# Patient Record
Sex: Male | Born: 1990 | Race: Black or African American | Hispanic: No | Marital: Single | Smoking: Current some day smoker
Health system: Southern US, Community
[De-identification: ages and names within clinical notes are randomized; demographics above are authoritative.]

## PROBLEM LIST (undated history)

## (undated) DIAGNOSIS — Z87898 Personal history of other specified conditions: Secondary | ICD-10-CM

## (undated) DIAGNOSIS — S0285XA Fracture of orbit, unspecified, initial encounter for closed fracture: Secondary | ICD-10-CM

## (undated) DIAGNOSIS — F1010 Alcohol abuse, uncomplicated: Secondary | ICD-10-CM

---

## 2013-09-11 ENCOUNTER — Encounter (HOSPITAL_COMMUNITY): Payer: Self-pay | Admitting: Emergency Medicine

## 2013-09-11 ENCOUNTER — Emergency Department (HOSPITAL_COMMUNITY)
Admission: EM | Admit: 2013-09-11 | Discharge: 2013-09-11 | Disposition: A | Discharge status: Home / Self Care | Payer: Medicaid Other | Source: Home / Self Care | Attending: Emergency Medicine | Admitting: Emergency Medicine

## 2013-09-11 DIAGNOSIS — Z98811 Dental restoration status: Secondary | ICD-10-CM

## 2013-09-11 NOTE — ED Notes (Signed)
Pt  Has  A   Loose  Front  Tooth  For  Over  1  Year     Ne  denys  Any pain he  denys  Any bleeding  He  denys  Any  Recent  Injury          The  Tooth is  In  Place  But  He  Can slide  It  Up and  Down

## 2013-09-11 NOTE — Discharge Instructions (Signed)
Get appointment to see Dr. Ocie DoyneScott Jensen as soon as possible.  In the meantime, do not eat any candy or sticky food.  Brush teeth gently twice daily.  Do not use dental floss.  You can purchase a dental cement at the pharmacy or can use toothpaste as a glue or denture cement.   Go to www.goodrx.com to look up your medications. This will give you a list of where you can find your prescriptions at the most affordable prices.   RESOURCE GUIDE  Dental Problems  Look up DebtSupply.plhttp://www.ncdental.org/ncds/Schedule.asp for a schedule of the [facility]'s free dental clinics called [address] [facility]. They have clinics all around [place]. Get there early and be prepared to wait.   [facility] [address]  [place], [place] [address] [phone]  [facility] [address] [place], [place] [phone]  Patients with Medicaid: [facility]                     [facility] [phone]                                [phone] Phone:  [phone]                                                  Phone:  [phone]  If unable to pay or uninsured, contact:  [facility] or [facility] to become qualified for the adult dental clinic.

## 2013-09-11 NOTE — ED Provider Notes (Signed)
  Chief Complaint   Chief Complaint  Patient presents with  . Dental Problem    History of Present Illness   Brandon Richards is a 23 year old [place] refugee who has only been in the [place] for about 4 weeks. He has a crown on his left, first, upper incisor. This came off a few months ago. He's able to pull it off and on at will. It's not painful. There is no swelling of the gingiva. He has no trouble swallowing or chewing. He does not have a dentist in the area.  Review of Systems   Other than as noted above, the patient denies any of the following symptoms: Systemic:  No fever, chills,  Or sweats. ENT:  No headache, ear ache, sore throat, nasal congestion, facial pain, or swelling. Lymphatic:  No adenopathy. Lungs:  No coughing, wheezing or shortness of breath.  PMFSH   Past medical history, family history, social history, meds, and allergies were reviewed.   Physical Examination     Vital signs:  BP 114/65  Pulse 68  Temp(Src) 98.9 F (37.2 C) (Oral)  Resp 16  SpO2 100% General:  Alert, oriented, in no distress. ENT:  TMs and canals normal.  Nasal mucosa normal. Mouth exam:  He has several carious teeth. His last, upper, first incisor has been crown. A crown is removable with pressure, but does seem to stay in place unless she pulls out. The gingiva surrounding it is noninflamed and it does not appear to be any decay. Neck:  No swelling or adenopathy. Lungs:  Breath sounds clear and equal bilaterally.  No wheezes, rales or rhonchi. Heart:  Regular rhythm.  No gallops or murmers. Skin:  Clear, warm and dry.   Assessment   The encounter diagnosis was S/p dental crown.  He has a dental crown that has come off. Suggested he buy over-the-counter dental adhesive, use toothpaste, or use a denture adhesive until he can get in to see the dentist.  Plan   1.  Meds:  The following meds were prescribed:  There are no discharge medications for this patient.   2.  Patient  Education/Counseling:  The patient was given appropriate handouts, self care instructions, and instructed in symptomatic relief. I suggested gentle brushing and avoidance of flossing. He should also avoid eating candy or sticky foods.  3.  Follow up:  The patient was told to follow up if no better in 3 to 4 days, if becoming worse in any way, and given some red flag symptoms such as difficulty swallowing or breathing which would prompt immediate return.  Follow up with a dentist as soon as posssible.     Reuben Likesavid C Dejaun Vidrio, MD 09/11/13 [phone]

## 2013-12-11 ENCOUNTER — Ambulatory Visit: Payer: Medicaid Other | Admitting: Family Medicine

## 2015-08-09 DIAGNOSIS — S0285XA Fracture of orbit, unspecified, initial encounter for closed fracture: Secondary | ICD-10-CM

## 2015-08-09 HISTORY — DX: Fracture of orbit, unspecified, initial encounter for closed fracture: S02.85XA

## 2015-10-25 ENCOUNTER — Encounter (HOSPITAL_COMMUNITY): Payer: Self-pay | Admitting: *Deleted

## 2015-10-25 ENCOUNTER — Emergency Department (HOSPITAL_COMMUNITY)
Admission: EM | Admit: 2015-10-25 | Discharge: 2015-10-25 | Disposition: A | Discharge status: Home / Self Care | Payer: Medicaid Other | Attending: Emergency Medicine | Admitting: Emergency Medicine

## 2015-10-25 DIAGNOSIS — S39012A Strain of muscle, fascia and tendon of lower back, initial encounter: Secondary | ICD-10-CM | POA: Insufficient documentation

## 2015-10-25 DIAGNOSIS — Y92410 Unspecified street and highway as the place of occurrence of the external cause: Secondary | ICD-10-CM | POA: Insufficient documentation

## 2015-10-25 DIAGNOSIS — Y998 Other external cause status: Secondary | ICD-10-CM | POA: Insufficient documentation

## 2015-10-25 DIAGNOSIS — F17200 Nicotine dependence, unspecified, uncomplicated: Secondary | ICD-10-CM | POA: Insufficient documentation

## 2015-10-25 DIAGNOSIS — Y9389 Activity, other specified: Secondary | ICD-10-CM | POA: Insufficient documentation

## 2015-10-25 MED ORDER — NAPROXEN 500 MG PO TABS
500.0000 mg | ORAL_TABLET | Freq: Once | ORAL | Status: AC
  Administered 2015-10-25: 500 mg via ORAL
  Filled 2015-10-25: qty 1

## 2015-10-25 MED ORDER — METHOCARBAMOL 500 MG PO TABS
500.0000 mg | ORAL_TABLET | Freq: Two times a day (BID) | ORAL | Status: DC
Start: 2015-10-25 — End: 2017-11-10

## 2015-10-25 MED ORDER — NAPROXEN 500 MG PO TABS: 500.0000 mg | ORAL_TABLET | Freq: Two times a day (BID) | ORAL | Status: DC
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
## 2016-06-11 ENCOUNTER — Emergency Department (HOSPITAL_COMMUNITY)
Admission: EM | Admit: 2016-06-11 | Discharge: 2016-06-11 | Disposition: A | Discharge status: Home / Self Care | Payer: BLUE CROSS/BLUE SHIELD | Attending: Emergency Medicine | Admitting: Emergency Medicine

## 2016-06-11 ENCOUNTER — Emergency Department (HOSPITAL_COMMUNITY): Payer: BLUE CROSS/BLUE SHIELD

## 2016-06-11 ENCOUNTER — Encounter (HOSPITAL_COMMUNITY): Payer: Self-pay | Admitting: Oncology

## 2016-06-11 DIAGNOSIS — Y939 Activity, unspecified: Secondary | ICD-10-CM | POA: Insufficient documentation

## 2016-06-11 DIAGNOSIS — S40021A Contusion of right upper arm, initial encounter: Secondary | ICD-10-CM | POA: Diagnosis not present

## 2016-06-11 DIAGNOSIS — S4991XA Unspecified injury of right shoulder and upper arm, initial encounter: Secondary | ICD-10-CM | POA: Diagnosis present

## 2016-06-11 DIAGNOSIS — F17200 Nicotine dependence, unspecified, uncomplicated: Secondary | ICD-10-CM | POA: Insufficient documentation

## 2016-06-11 DIAGNOSIS — Y929 Unspecified place or not applicable: Secondary | ICD-10-CM | POA: Diagnosis not present

## 2016-06-11 DIAGNOSIS — Y999 Unspecified external cause status: Secondary | ICD-10-CM | POA: Insufficient documentation

## 2016-06-11 MED ORDER — IBUPROFEN 800 MG PO TABS
800.0000 mg | ORAL_TABLET | Freq: Once | ORAL | Status: AC
  Administered 2016-06-11: 800 mg via ORAL
  Filled 2016-06-11: qty 1

## 2016-06-11 MED ORDER — NAPROXEN 500 MG PO TABS: 500.0000 mg | ORAL_TABLET | Freq: Two times a day (BID) | ORAL | 0 refills | Status: DC | PRN
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
## 2016-06-11 NOTE — ED Notes (Signed)
Pt was sleeping in triage.  Would not answer triage questions w/o asking multiple times.  When pt was asked to go to the lobby until a room became available pt became verbally aggressive to staff, stating he came by ambulance and he wanted a room.  Pt was given warm blankets and informed that he would have a room as soon as it was available.

## 2017-08-30 ENCOUNTER — Other Ambulatory Visit: Payer: Self-pay

## 2017-08-30 ENCOUNTER — Emergency Department (HOSPITAL_COMMUNITY)
Admission: EM | Admit: 2017-08-30 | Discharge: 2017-08-30 | Disposition: A | Discharge status: Home / Self Care | Payer: BLUE CROSS/BLUE SHIELD | Attending: Emergency Medicine | Admitting: Emergency Medicine

## 2017-08-30 ENCOUNTER — Encounter (HOSPITAL_COMMUNITY): Payer: Self-pay | Admitting: Obstetrics and Gynecology

## 2017-08-30 DIAGNOSIS — Z046 Encounter for general psychiatric examination, requested by authority: Secondary | ICD-10-CM | POA: Diagnosis present

## 2017-08-30 DIAGNOSIS — F1010 Alcohol abuse, uncomplicated: Secondary | ICD-10-CM | POA: Diagnosis not present

## 2017-08-30 DIAGNOSIS — Z79899 Other long term (current) drug therapy: Secondary | ICD-10-CM | POA: Diagnosis not present

## 2017-08-30 DIAGNOSIS — F17210 Nicotine dependence, cigarettes, uncomplicated: Secondary | ICD-10-CM | POA: Diagnosis not present

## 2017-08-30 LAB — COMPREHENSIVE METABOLIC PANEL
ALK PHOS: 65 U/L (ref 38–126)
ALT: 26 U/L (ref 17–63)
ANION GAP: 7 (ref 5–15)
AST: 32 U/L (ref 15–41)
Albumin: 4.4 g/dL (ref 3.5–5.0)
BUN: 11 mg/dL (ref 6–20)
CALCIUM: 8.8 mg/dL — AB (ref 8.9–10.3)
CO2: 28 mmol/L (ref 22–32)
Chloride: 103 mmol/L (ref 101–111)
Creatinine, Ser: 0.84 mg/dL (ref 0.61–1.24)
GFR calc non Af Amer: >60 mL/min (ref >60)
Glucose, Bld: 86 mg/dL (ref 65–99)
POTASSIUM: 3.9 mmol/L (ref 3.5–5.1)
SODIUM: 138 mmol/L (ref 135–145)
TOTAL PROTEIN: 7.3 g/dL (ref 6.5–8.1)
Total Bilirubin: 0.9 mg/dL (ref 0.3–1.2)

## 2017-08-30 LAB — CBC
HCT: 46.1 % (ref 39.0–52.0)
Hemoglobin: 16.3 g/dL (ref 13.0–17.0)
MCH: 34.8 pg — ABNORMAL HIGH (ref 26.0–34.0)
MCHC: 35.4 g/dL (ref 30.0–36.0)
MCV: 98.3 fL (ref 78.0–100.0)
Platelets: 211 10*3/uL (ref 150–400)
RBC: 4.69 MIL/uL (ref 4.22–5.81)
RDW: 12.4 % (ref 11.5–15.5)
WBC: 4.2 10*3/uL (ref 4.0–10.5)

## 2017-08-30 LAB — RAPID URINE DRUG SCREEN, HOSP PERFORMED
AMPHETAMINES: NOT DETECTED
BENZODIAZEPINES: NOT DETECTED
Barbiturates: NOT DETECTED
Cocaine: NOT DETECTED
OPIATES: NOT DETECTED
Tetrahydrocannabinol: NOT DETECTED

## 2017-08-30 LAB — ETHANOL: Alcohol, Ethyl (B): 221 mg/dL — ABNORMAL HIGH (ref <10)

## 2017-08-30 MED ORDER — VITAMIN B-1 100 MG PO TABS
100.0000 mg | ORAL_TABLET | Freq: Every day | ORAL | Status: DC
  Administered 2017-08-30: 100 mg via ORAL
  Filled 2017-08-30: qty 1

## 2017-08-30 MED ORDER — LORAZEPAM 2 MG/ML IJ SOLN: 0.0000 mg | Freq: Two times a day (BID) | INTRAMUSCULAR | Status: DC

## 2017-08-30 MED ORDER — THIAMINE HCL 100 MG/ML IJ SOLN: 100.0000 mg | Freq: Every day | INTRAMUSCULAR | Status: DC

## 2017-08-30 MED ORDER — LORAZEPAM 1 MG PO TABS: 0.0000 mg | ORAL_TABLET | Freq: Four times a day (QID) | ORAL | Status: DC

## 2017-08-30 MED ORDER — NICOTINE 21 MG/24HR TD PT24: 21.0000 mg | MEDICATED_PATCH | Freq: Every day | TRANSDERMAL | Status: DC

## 2017-08-30 MED ORDER — LORAZEPAM 1 MG PO TABS: 0.0000 mg | ORAL_TABLET | Freq: Two times a day (BID) | ORAL | Status: DC

## 2017-08-30 MED ORDER — LORAZEPAM 2 MG/ML IJ SOLN: 0.0000 mg | Freq: Four times a day (QID) | INTRAMUSCULAR | Status: DC
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
## 2017-08-30 NOTE — ED Notes (Signed)
TTS at bedside. 

## 2017-08-30 NOTE — Discharge Instructions (Signed)
Please read attached information regarding your condition. Follow-up at one of the places listed below in the resource guide to help with your alcohol use. Return to ED for worsening symptoms, tremors or seizures, loss of consciousness or head injuries.

## 2017-08-30 NOTE — ED Notes (Signed)
ED Provider at bedside. 

## 2017-08-30 NOTE — ED Notes (Signed)
Pt assisted to use TCU phone to call his daughter

## 2017-08-30 NOTE — ED Notes (Signed)
Discharge instructions reviewed with pt and pt given counseling regarding seeking help in regards to alcohol consumption.
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
## 2017-08-30 NOTE — ED Notes (Signed)
Pt belongings placed in locked 28
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
## 2017-08-30 NOTE — ED Notes (Signed)
Pt's belongings moved to locker 26 and pt wanded by security. Pt currently dressed out in paper scrubs and trained sitter has pt within sights.

## 2017-08-30 NOTE — ED Provider Notes (Signed)
  Face-to-face evaluation   History: Patient was petitioned for alcoholism, and dangerous behavior, by family members.  He relates drinking alcohol, because "it is my day off."  He denies intent to harm self or others.  Physical exam: Alert, calm, cooperative.  No dysarthria or aphasia.  No respiratory distress.  Medical screening examination/treatment/procedure(s) were conducted as a shared visit with non-physician practitioner(s) and myself.  I personally evaluated the patient during the encounter    Mancel BaleWentz, Anija Brickner, MD 08/31/17 0000

## 2017-10-01 ENCOUNTER — Emergency Department (HOSPITAL_COMMUNITY)
Admission: EM | Admit: 2017-10-01 | Discharge: 2017-10-01 | Disposition: A | Discharge status: Home / Self Care | Payer: PRIVATE HEALTH INSURANCE | Attending: Emergency Medicine | Admitting: Emergency Medicine

## 2017-10-01 ENCOUNTER — Encounter (HOSPITAL_COMMUNITY): Payer: Self-pay | Admitting: Emergency Medicine

## 2017-10-01 DIAGNOSIS — Z79899 Other long term (current) drug therapy: Secondary | ICD-10-CM | POA: Insufficient documentation

## 2017-10-01 DIAGNOSIS — R109 Unspecified abdominal pain: Secondary | ICD-10-CM | POA: Insufficient documentation

## 2017-10-01 DIAGNOSIS — F10129 Alcohol abuse with intoxication, unspecified: Secondary | ICD-10-CM | POA: Insufficient documentation

## 2017-10-01 DIAGNOSIS — F17210 Nicotine dependence, cigarettes, uncomplicated: Secondary | ICD-10-CM | POA: Insufficient documentation

## 2017-10-01 DIAGNOSIS — F10920 Alcohol use, unspecified with intoxication, uncomplicated: Secondary | ICD-10-CM

## 2017-10-01 LAB — RAPID URINE DRUG SCREEN, HOSP PERFORMED
AMPHETAMINES: NOT DETECTED
BENZODIAZEPINES: NOT DETECTED
Barbiturates: NOT DETECTED
COCAINE: NOT DETECTED
Opiates: NOT DETECTED
TETRAHYDROCANNABINOL: NOT DETECTED

## 2017-10-01 NOTE — ED Notes (Signed)
Patient able to ambulate in the hall without difficulty and without assistance. Anticipating discharge at this time.

## 2017-10-01 NOTE — ED Triage Notes (Addendum)
Pt brought home for abd pain onset after drinking ETOH , [facility]  Then called EMS for abd pain request transport to hospital. VSS 112/78 hr 80 resp 16 Pt not cooperative with EMS staff.

## 2017-10-01 NOTE — ED Notes (Signed)
Patient being discharged at this time and being escorted out by security at this time.
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
## 2017-10-01 NOTE — Discharge Instructions (Signed)
Please follow up with [facility]. Return to ED with new or concerning symptoms or if any of the below conditions develop.  Get help right away if: You get shaky when you try to stop drinking. You start shaking and you cannot control it (have a seizure). You throw up blood. The blood may be bright red or look like coffee grounds. You see blood in your poop (stool). The blood may: Be bright red. Make your poop black and tarry and make it smell bad. You start to feel light-headed. You pass out.

## 2017-10-01 NOTE — ED Notes (Signed)
Bed: [facility] Expected date:  Expected time:  Means of arrival:  Comments: 
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
## 2017-11-08 ENCOUNTER — Inpatient Hospital Stay (HOSPITAL_COMMUNITY)
Admission: EM | Admit: 2017-11-08 | Discharge: 2017-11-10 | DRG: 343 | Disposition: A | Discharge status: Home / Self Care | Payer: Self-pay | Attending: Surgery | Admitting: Surgery

## 2017-11-08 ENCOUNTER — Encounter (HOSPITAL_COMMUNITY): Payer: Self-pay | Admitting: Family Medicine

## 2017-11-08 ENCOUNTER — Emergency Department (HOSPITAL_COMMUNITY): Payer: Self-pay

## 2017-11-08 DIAGNOSIS — F1010 Alcohol abuse, uncomplicated: Secondary | ICD-10-CM

## 2017-11-08 DIAGNOSIS — F17210 Nicotine dependence, cigarettes, uncomplicated: Secondary | ICD-10-CM | POA: Diagnosis present

## 2017-11-08 DIAGNOSIS — Z87898 Personal history of other specified conditions: Secondary | ICD-10-CM

## 2017-11-08 DIAGNOSIS — K3580 Unspecified acute appendicitis: Principal | ICD-10-CM

## 2017-11-08 DIAGNOSIS — Z720 Tobacco use: Secondary | ICD-10-CM

## 2017-11-08 HISTORY — DX: Alcohol abuse, uncomplicated: F10.10

## 2017-11-08 HISTORY — DX: Fracture of orbit, unspecified, initial encounter for closed fracture: S02.85XA

## 2017-11-08 HISTORY — DX: Personal history of other specified conditions: Z87.898

## 2017-11-08 LAB — COMPREHENSIVE METABOLIC PANEL
ALBUMIN: 4.3 g/dL (ref 3.5–5.0)
ALT: 28 U/L (ref 17–63)
AST: 32 U/L (ref 15–41)
Alkaline Phosphatase: 59 U/L (ref 38–126)
Anion gap: 10 (ref 5–15)
BUN: 8 mg/dL (ref 6–20)
CHLORIDE: 99 mmol/L — AB (ref 101–111)
CO2: 25 mmol/L (ref 22–32)
Calcium: 9 mg/dL (ref 8.9–10.3)
Creatinine, Ser: 0.77 mg/dL (ref 0.61–1.24)
GFR calc Af Amer: >60 mL/min (ref >60)
Glucose, Bld: 101 mg/dL — ABNORMAL HIGH (ref 65–99)
Potassium: 3.7 mmol/L (ref 3.5–5.1)
SODIUM: 134 mmol/L — AB (ref 135–145)
Total Bilirubin: 1.4 mg/dL — ABNORMAL HIGH (ref 0.3–1.2)
Total Protein: 7.6 g/dL (ref 6.5–8.1)

## 2017-11-08 LAB — CBC
HEMATOCRIT: 48.3 % (ref 39.0–52.0)
Hemoglobin: 17.1 g/dL — ABNORMAL HIGH (ref 13.0–17.0)
MCH: 35.3 pg — ABNORMAL HIGH (ref 26.0–34.0)
MCHC: 35.4 g/dL (ref 30.0–36.0)
MCV: 99.8 fL (ref 78.0–100.0)
Platelets: 184 10*3/uL (ref 150–400)
RBC: 4.84 MIL/uL (ref 4.22–5.81)
RDW: 12.1 % (ref 11.5–15.5)
WBC: 9 10*3/uL (ref 4.0–10.5)

## 2017-11-08 LAB — LIPASE, BLOOD: LIPASE: 33 U/L (ref 11–51)

## 2017-11-08 LAB — URINALYSIS, ROUTINE W REFLEX MICROSCOPIC
Bilirubin Urine: NEGATIVE
GLUCOSE, UA: NEGATIVE mg/dL
Hgb urine dipstick: NEGATIVE
Ketones, ur: 20 mg/dL — AB
LEUKOCYTES UA: NEGATIVE
Nitrite: NEGATIVE
PH: 6 (ref 5.0–8.0)
Protein, ur: NEGATIVE mg/dL
Specific Gravity, Urine: 1.036 — ABNORMAL HIGH (ref 1.005–1.030)

## 2017-11-08 MED ORDER — ADULT MULTIVITAMIN W/MINERALS CH
1.0000 | ORAL_TABLET | Freq: Every day | ORAL | Status: DC
  Administered 2017-11-10: 1 via ORAL
  Filled 2017-11-08: qty 1

## 2017-11-08 MED ORDER — LORAZEPAM 2 MG/ML IJ SOLN
0.0000 mg | Freq: Four times a day (QID) | INTRAMUSCULAR | Status: DC
  Administered 2017-11-09: 1 mg via INTRAVENOUS
  Filled 2017-11-08: qty 1

## 2017-11-08 MED ORDER — METHOCARBAMOL 500 MG PO TABS: 1000.0000 mg | ORAL_TABLET | Freq: Four times a day (QID) | ORAL | Status: DC | PRN

## 2017-11-08 MED ORDER — NICOTINE 21 MG/24HR TD PT24
21.0000 mg | MEDICATED_PATCH | Freq: Every day | TRANSDERMAL | Status: DC
  Administered 2017-11-09 – 2017-11-10 (×2): 21 mg via TRANSDERMAL
  Filled 2017-11-08 (×2): qty 1

## 2017-11-08 MED ORDER — HYDROCORTISONE 2.5 % RE CREA: 1.0000 "application " | TOPICAL_CREAM | Freq: Four times a day (QID) | RECTAL | Status: DC | PRN

## 2017-11-08 MED ORDER — ONDANSETRON 4 MG PO TBDP: 4.0000 mg | ORAL_TABLET | Freq: Four times a day (QID) | ORAL | Status: DC | PRN

## 2017-11-08 MED ORDER — ACETAMINOPHEN 325 MG PO TABS: 650.0000 mg | ORAL_TABLET | Freq: Four times a day (QID) | ORAL | Status: DC | PRN

## 2017-11-08 MED ORDER — SODIUM CHLORIDE 0.9 % IV BOLUS
1000.0000 mL | Freq: Once | INTRAVENOUS | Status: AC
  Administered 2017-11-08: 1000 mL via INTRAVENOUS

## 2017-11-08 MED ORDER — LACTATED RINGERS IV SOLN
INTRAVENOUS | Status: DC
  Administered 2017-11-09 (×2): via INTRAVENOUS
  Administered 2017-11-09: 100 mL/h via INTRAVENOUS
  Administered 2017-11-09 – 2017-11-10 (×2): via INTRAVENOUS

## 2017-11-08 MED ORDER — VITAMIN B-1 100 MG PO TABS
100.0000 mg | ORAL_TABLET | Freq: Every day | ORAL | Status: DC
  Administered 2017-11-10: 100 mg via ORAL
  Filled 2017-11-08: qty 1

## 2017-11-08 MED ORDER — BISACODYL 10 MG RE SUPP: 10.0000 mg | Freq: Two times a day (BID) | RECTAL | Status: DC | PRN

## 2017-11-08 MED ORDER — ALUM & MAG HYDROXIDE-SIMETH 200-200-20 MG/5ML PO SUSP: 30.0000 mL | Freq: Four times a day (QID) | ORAL | Status: DC | PRN

## 2017-11-08 MED ORDER — CEFTRIAXONE SODIUM 2 G IJ SOLR
2.0000 g | INTRAMUSCULAR | Status: DC
  Filled 2017-11-08: qty 20

## 2017-11-08 MED ORDER — LORAZEPAM 1 MG PO TABS: 1.0000 mg | ORAL_TABLET | Freq: Four times a day (QID) | ORAL | Status: DC | PRN

## 2017-11-08 MED ORDER — ENOXAPARIN SODIUM 40 MG/0.4ML ~~LOC~~ SOLN: 40.0000 mg | SUBCUTANEOUS | Status: DC

## 2017-11-08 MED ORDER — CELECOXIB 200 MG PO CAPS
200.0000 mg | ORAL_CAPSULE | ORAL | Status: AC
  Administered 2017-11-09: 200 mg via ORAL
  Filled 2017-11-08: qty 1

## 2017-11-08 MED ORDER — LORAZEPAM 2 MG/ML IJ SOLN: 0.0000 mg | Freq: Two times a day (BID) | INTRAMUSCULAR | Status: DC

## 2017-11-08 MED ORDER — THIAMINE HCL 100 MG/ML IJ SOLN
100.0000 mg | Freq: Every day | INTRAMUSCULAR | Status: DC
  Administered 2017-11-09: 100 mg via INTRAVENOUS
  Filled 2017-11-08: qty 2

## 2017-11-08 MED ORDER — ACETAMINOPHEN 500 MG PO TABS
1000.0000 mg | ORAL_TABLET | ORAL | Status: AC
  Administered 2017-11-09: 1000 mg via ORAL
  Filled 2017-11-08: qty 2

## 2017-11-08 MED ORDER — GABAPENTIN 300 MG PO CAPS
300.0000 mg | ORAL_CAPSULE | ORAL | Status: AC
  Administered 2017-11-09: 300 mg via ORAL
  Filled 2017-11-08: qty 1

## 2017-11-08 MED ORDER — GUAIFENESIN-DM 100-10 MG/5ML PO SYRP: 10.0000 mL | ORAL_SOLUTION | ORAL | Status: DC | PRN

## 2017-11-08 MED ORDER — IOPAMIDOL (ISOVUE-300) INJECTION 61%
100.0000 mL | Freq: Once | INTRAVENOUS | Status: AC | PRN
  Administered 2017-11-08: 100 mL via INTRAVENOUS

## 2017-11-08 MED ORDER — SODIUM CHLORIDE 0.9 % IJ SOLN
INTRAMUSCULAR | Status: AC
  Administered 2017-11-08: 22:00:00
  Filled 2017-11-08: qty 50

## 2017-11-08 MED ORDER — ACETAMINOPHEN 650 MG RE SUPP: 650.0000 mg | Freq: Four times a day (QID) | RECTAL | Status: DC | PRN

## 2017-11-08 MED ORDER — METOCLOPRAMIDE HCL 5 MG/ML IJ SOLN: 10.0000 mg | Freq: Four times a day (QID) | INTRAMUSCULAR | Status: DC | PRN

## 2017-11-08 MED ORDER — MENTHOL 3 MG MT LOZG: 1.0000 | LOZENGE | OROMUCOSAL | Status: DC | PRN

## 2017-11-08 MED ORDER — GABAPENTIN 300 MG PO CAPS
300.0000 mg | ORAL_CAPSULE | Freq: Every day | ORAL | Status: DC
  Administered 2017-11-09 (×2): 300 mg via ORAL
  Filled 2017-11-08 (×2): qty 1

## 2017-11-08 MED ORDER — CHLORHEXIDINE GLUCONATE CLOTH 2 % EX PADS
6.0000 | MEDICATED_PAD | Freq: Once | CUTANEOUS | Status: AC
  Administered 2017-11-09: 6 via TOPICAL

## 2017-11-08 MED ORDER — PHENOL 1.4 % MT LIQD: 1.0000 | OROMUCOSAL | Status: DC | PRN

## 2017-11-08 MED ORDER — PIPERACILLIN-TAZOBACTAM 3.375 G IVPB
3.3750 g | Freq: Three times a day (TID) | INTRAVENOUS | Status: DC
  Administered 2017-11-09 (×2): 3.375 g via INTRAVENOUS
  Filled 2017-11-08 (×2): qty 50

## 2017-11-08 MED ORDER — MORPHINE SULFATE (PF) 4 MG/ML IV SOLN
4.0000 mg | Freq: Once | INTRAVENOUS | Status: AC
  Administered 2017-11-08: 4 mg via INTRAVENOUS
  Filled 2017-11-08: qty 1

## 2017-11-08 MED ORDER — ONDANSETRON HCL 4 MG/2ML IJ SOLN
4.0000 mg | Freq: Four times a day (QID) | INTRAMUSCULAR | Status: DC | PRN
  Administered 2017-11-09: 4 mg via INTRAVENOUS
  Filled 2017-11-08: qty 2

## 2017-11-08 MED ORDER — KETOROLAC TROMETHAMINE 30 MG/ML IJ SOLN
15.0000 mg | Freq: Once | INTRAMUSCULAR | Status: AC
  Administered 2017-11-08: 15 mg via INTRAVENOUS
  Filled 2017-11-08: qty 1

## 2017-11-08 MED ORDER — MAGIC MOUTHWASH
15.0000 mL | Freq: Four times a day (QID) | ORAL | Status: DC | PRN
  Filled 2017-11-08: qty 15

## 2017-11-08 MED ORDER — OXYCODONE HCL 5 MG PO TABS
5.0000 mg | ORAL_TABLET | ORAL | Status: DC | PRN
  Administered 2017-11-09 (×2): 5 mg via ORAL
  Filled 2017-11-08 (×2): qty 1

## 2017-11-08 MED ORDER — FOLIC ACID 1 MG PO TABS
1.0000 mg | ORAL_TABLET | Freq: Every day | ORAL | Status: DC
  Administered 2017-11-10: 1 mg via ORAL
  Filled 2017-11-08: qty 1

## 2017-11-08 MED ORDER — METRONIDAZOLE IN NACL 5-0.79 MG/ML-% IV SOLN: 500.0000 mg | INTRAVENOUS | Status: DC

## 2017-11-08 MED ORDER — IOPAMIDOL (ISOVUE-300) INJECTION 61%
INTRAVENOUS | Status: AC
Start: 2017-11-08 — End: 2017-11-09
  Filled 2017-11-08: qty 100

## 2017-11-08 MED ORDER — DIPHENHYDRAMINE HCL 50 MG/ML IJ SOLN: 12.5000 mg | Freq: Four times a day (QID) | INTRAMUSCULAR | Status: DC | PRN

## 2017-11-08 MED ORDER — SIMETHICONE 80 MG PO CHEW: 40.0000 mg | CHEWABLE_TABLET | Freq: Four times a day (QID) | ORAL | Status: DC | PRN

## 2017-11-08 MED ORDER — DIPHENHYDRAMINE HCL 12.5 MG/5ML PO ELIX: 12.5000 mg | ORAL_SOLUTION | Freq: Four times a day (QID) | ORAL | Status: DC | PRN

## 2017-11-08 MED ORDER — KETOROLAC TROMETHAMINE 15 MG/ML IJ SOLN
15.0000 mg | Freq: Four times a day (QID) | INTRAMUSCULAR | Status: DC | PRN
  Administered 2017-11-09: 15 mg via INTRAVENOUS
  Filled 2017-11-08: qty 1

## 2017-11-08 MED ORDER — LIP MEDEX EX OINT
1.0000 "application " | TOPICAL_OINTMENT | Freq: Two times a day (BID) | CUTANEOUS | Status: DC
  Administered 2017-11-09 (×3): 1 via TOPICAL
  Filled 2017-11-08 (×2): qty 7

## 2017-11-08 MED ORDER — LORAZEPAM 2 MG/ML IJ SOLN: 1.0000 mg | Freq: Four times a day (QID) | INTRAMUSCULAR | Status: DC | PRN

## 2017-11-08 MED ORDER — CHLORPROMAZINE HCL 25 MG/ML IJ SOLN
25.0000 mg | Freq: Four times a day (QID) | INTRAMUSCULAR | Status: DC | PRN
  Filled 2017-11-08: qty 1

## 2017-11-08 MED ORDER — METHOCARBAMOL 1000 MG/10ML IJ SOLN
1000.0000 mg | Freq: Four times a day (QID) | INTRAVENOUS | Status: DC | PRN
  Filled 2017-11-08: qty 10

## 2017-11-08 MED ORDER — HYDROCORTISONE 1 % EX CREA: 1.0000 "application " | TOPICAL_CREAM | Freq: Three times a day (TID) | CUTANEOUS | Status: DC | PRN

## 2017-11-08 MED ORDER — ONDANSETRON HCL 4 MG/2ML IJ SOLN
4.0000 mg | Freq: Once | INTRAMUSCULAR | Status: AC
  Administered 2017-11-08: 4 mg via INTRAVENOUS
  Filled 2017-11-08: qty 2
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
## 2017-11-08 NOTE — ED Triage Notes (Signed)
[facility]. Patient is complaining of generalized abd pain with vomiting x 3. Symptoms started at 9:00am today. Also, reports chills and sweating.
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
## 2017-11-08 NOTE — ED Notes (Signed)
Attempted to call report to Maralyn SagoSarah, RN for room 1538. Will call back in 10 minutes to give report.

## 2017-11-09 ENCOUNTER — Inpatient Hospital Stay (HOSPITAL_COMMUNITY): Payer: Self-pay | Admitting: Certified Registered Nurse Anesthetist

## 2017-11-09 ENCOUNTER — Other Ambulatory Visit: Payer: Self-pay

## 2017-11-09 ENCOUNTER — Encounter (HOSPITAL_COMMUNITY): Admission: EM | Disposition: A | Discharge status: Home / Self Care | Payer: Self-pay | Source: Home / Self Care

## 2017-11-09 ENCOUNTER — Encounter (HOSPITAL_COMMUNITY): Payer: Self-pay | Admitting: Certified Registered Nurse Anesthetist

## 2017-11-09 HISTORY — PX: LAPAROSCOPIC APPENDECTOMY: SHX408

## 2017-11-09 LAB — HIV ANTIBODY (ROUTINE TESTING W REFLEX): HIV Screen 4th Generation wRfx: NONREACTIVE

## 2017-11-09 LAB — SURGICAL PCR SCREEN
MRSA, PCR: NEGATIVE
Staphylococcus aureus: NEGATIVE

## 2017-11-09 SURGERY — APPENDECTOMY, LAPAROSCOPIC
Anesthesia: General | Site: Abdomen

## 2017-11-09 MED ORDER — FENTANYL CITRATE (PF) 100 MCG/2ML IJ SOLN
INTRAMUSCULAR | Status: AC
  Filled 2017-11-09: qty 2

## 2017-11-09 MED ORDER — SUGAMMADEX SODIUM 200 MG/2ML IV SOLN
INTRAVENOUS | Status: DC | PRN
  Administered 2017-11-09: 200 mg via INTRAVENOUS

## 2017-11-09 MED ORDER — PROMETHAZINE HCL 25 MG/ML IJ SOLN: 6.2500 mg | INTRAMUSCULAR | Status: DC | PRN

## 2017-11-09 MED ORDER — MIDAZOLAM HCL 5 MG/5ML IJ SOLN
INTRAMUSCULAR | Status: DC | PRN
  Administered 2017-11-09: 2 mg via INTRAVENOUS

## 2017-11-09 MED ORDER — BUPIVACAINE HCL (PF) 0.5 % IJ SOLN
INTRAMUSCULAR | Status: AC
  Filled 2017-11-09: qty 30

## 2017-11-09 MED ORDER — KETOROLAC TROMETHAMINE 30 MG/ML IJ SOLN
INTRAMUSCULAR | Status: DC | PRN
  Administered 2017-11-09: 30 mg via INTRAVENOUS

## 2017-11-09 MED ORDER — DEXAMETHASONE SODIUM PHOSPHATE 10 MG/ML IJ SOLN
INTRAMUSCULAR | Status: DC | PRN
  Administered 2017-11-09: 10 mg via INTRAVENOUS

## 2017-11-09 MED ORDER — LIDOCAINE 2% (20 MG/ML) 5 ML SYRINGE
INTRAMUSCULAR | Status: AC
  Filled 2017-11-09: qty 5

## 2017-11-09 MED ORDER — CEFTRIAXONE SODIUM 2 G IJ SOLR: 2.0000 g | INTRAMUSCULAR | Status: DC

## 2017-11-09 MED ORDER — LACTATED RINGERS IV SOLN: INTRAVENOUS | Status: DC

## 2017-11-09 MED ORDER — FENTANYL CITRATE (PF) 100 MCG/2ML IJ SOLN
INTRAMUSCULAR | Status: DC | PRN
  Administered 2017-11-09 (×2): 100 ug via INTRAVENOUS

## 2017-11-09 MED ORDER — SUGAMMADEX SODIUM 200 MG/2ML IV SOLN
INTRAVENOUS | Status: AC
  Filled 2017-11-09: qty 2

## 2017-11-09 MED ORDER — FENTANYL CITRATE (PF) 100 MCG/2ML IJ SOLN
25.0000 ug | INTRAMUSCULAR | Status: DC | PRN
  Administered 2017-11-09: 25 ug via INTRAVENOUS

## 2017-11-09 MED ORDER — SUCCINYLCHOLINE CHLORIDE 200 MG/10ML IV SOSY
PREFILLED_SYRINGE | INTRAVENOUS | Status: DC | PRN
  Administered 2017-11-09: 100 mg via INTRAVENOUS

## 2017-11-09 MED ORDER — BUPIVACAINE HCL (PF) 0.5 % IJ SOLN
INTRAMUSCULAR | Status: DC | PRN
  Administered 2017-11-09: 20 mL

## 2017-11-09 MED ORDER — PROPOFOL 10 MG/ML IV BOLUS
INTRAVENOUS | Status: DC | PRN
  Administered 2017-11-09: 150 mg via INTRAVENOUS

## 2017-11-09 MED ORDER — PHENYLEPHRINE 40 MCG/ML (10ML) SYRINGE FOR IV PUSH (FOR BLOOD PRESSURE SUPPORT)
PREFILLED_SYRINGE | INTRAVENOUS | Status: AC
  Filled 2017-11-09: qty 10

## 2017-11-09 MED ORDER — ROCURONIUM BROMIDE 10 MG/ML (PF) SYRINGE
PREFILLED_SYRINGE | INTRAVENOUS | Status: AC
  Filled 2017-11-09: qty 5

## 2017-11-09 MED ORDER — ROCURONIUM BROMIDE 10 MG/ML (PF) SYRINGE
PREFILLED_SYRINGE | INTRAVENOUS | Status: DC | PRN
  Administered 2017-11-09: 30 mg via INTRAVENOUS

## 2017-11-09 MED ORDER — DEXAMETHASONE SODIUM PHOSPHATE 10 MG/ML IJ SOLN
INTRAMUSCULAR | Status: AC
  Filled 2017-11-09: qty 1

## 2017-11-09 MED ORDER — ONDANSETRON HCL 4 MG/2ML IJ SOLN
INTRAMUSCULAR | Status: AC
  Filled 2017-11-09: qty 2

## 2017-11-09 MED ORDER — METRONIDAZOLE IN NACL 5-0.79 MG/ML-% IV SOLN
500.0000 mg | Freq: Three times a day (TID) | INTRAVENOUS | Status: DC
  Administered 2017-11-09 – 2017-11-10 (×3): 500 mg via INTRAVENOUS
  Filled 2017-11-09 (×5): qty 100

## 2017-11-09 MED ORDER — ONDANSETRON HCL 4 MG/2ML IJ SOLN
INTRAMUSCULAR | Status: DC | PRN
  Administered 2017-11-09: 4 mg via INTRAVENOUS

## 2017-11-09 MED ORDER — 0.9 % SODIUM CHLORIDE (POUR BTL) OPTIME
TOPICAL | Status: DC | PRN
  Administered 2017-11-09: 1000 mL

## 2017-11-09 MED ORDER — MIDAZOLAM HCL 2 MG/2ML IJ SOLN
INTRAMUSCULAR | Status: AC
  Filled 2017-11-09: qty 2

## 2017-11-09 MED ORDER — SODIUM CHLORIDE 0.9 % IV SOLN
2.0000 g | INTRAVENOUS | Status: DC
  Administered 2017-11-09: 2 g via INTRAVENOUS
  Filled 2017-11-09 (×3): qty 20
  Filled 2017-11-09: qty 2

## 2017-11-09 MED ORDER — LACTATED RINGERS IR SOLN
Status: DC | PRN
Start: 2017-11-09 — End: 2017-11-09
  Administered 2017-11-09: 1000 mL

## 2017-11-09 MED ORDER — LIDOCAINE 2% (20 MG/ML) 5 ML SYRINGE
INTRAMUSCULAR | Status: DC | PRN
  Administered 2017-11-09: 75 mg via INTRAVENOUS

## 2017-11-09 SURGICAL SUPPLY — 29 items
APPLIER CLIP 5 13 M/L LIGAMAX5 (MISCELLANEOUS)
CHLORAPREP W/TINT 26ML (MISCELLANEOUS) ×3 IMPLANT
CLIP APPLIE 5 13 M/L LIGAMAX5 (MISCELLANEOUS) IMPLANT
COVER SURGICAL LIGHT HANDLE (MISCELLANEOUS) ×3 IMPLANT
CUTTER FLEX LINEAR 45M (STAPLE) IMPLANT
DECANTER SPIKE VIAL GLASS SM (MISCELLANEOUS) ×3 IMPLANT
DERMABOND ADVANCED (GAUZE/BANDAGES/DRESSINGS) ×2
DERMABOND ADVANCED .7 DNX12 (GAUZE/BANDAGES/DRESSINGS) ×1 IMPLANT
DRAPE LAPAROSCOPIC ABDOMINAL (DRAPES) ×3 IMPLANT
ELECT COAG MONOPOLAR (ELECTROSURGICAL) ×3
ELECT REM PT RETURN 15FT ADLT (MISCELLANEOUS) ×3 IMPLANT
ELECTRODE COAG MONOPOLAR (ELECTROSURGICAL) ×1 IMPLANT
GLOVE SURG SIGNA 7.5 PF LTX (GLOVE) ×6 IMPLANT
GOWN STRL REUS W/TWL XL LVL3 (GOWN DISPOSABLE) ×6 IMPLANT
KIT BASIN OR (CUSTOM PROCEDURE TRAY) ×3 IMPLANT
POUCH SPECIMEN RETRIEVAL 10MM (ENDOMECHANICALS) ×3 IMPLANT
RELOAD 45 VASCULAR/THIN (ENDOMECHANICALS) IMPLANT
RELOAD STAPLE TA45 3.5 REG BLU (ENDOMECHANICALS) ×3 IMPLANT
SET IRRIG TUBING LAPAROSCOPIC (IRRIGATION / IRRIGATOR) ×3 IMPLANT
SHEARS HARMONIC ACE PLUS 36CM (ENDOMECHANICALS) ×3 IMPLANT
SLEEVE XCEL OPT CAN 5 100 (ENDOMECHANICALS) ×3 IMPLANT
SUT MNCRL AB 4-0 PS2 18 (SUTURE) ×3 IMPLANT
TOWEL OR 17X26 10 PK STRL BLUE (TOWEL DISPOSABLE) ×3 IMPLANT
TOWEL OR NON WOVEN STRL DISP B (DISPOSABLE) ×3 IMPLANT
TRAY FOLEY W/METER SILVER 16FR (SET/KITS/TRAYS/PACK) ×3 IMPLANT
TRAY LAPAROSCOPIC (CUSTOM PROCEDURE TRAY) ×3 IMPLANT
TROCAR BLADELESS OPT 5 100 (ENDOMECHANICALS) ×3 IMPLANT
TROCAR XCEL BLUNT TIP 100MML (ENDOMECHANICALS) ×3 IMPLANT
TUBING INSUF HEATED (TUBING) ×3 IMPLANT

## 2017-11-09 NOTE — Progress Notes (Signed)
Subjective: Not very talkative this AM. Endorses RLQ pain. Denies fever, chills, nausea, vomiting. Had a normal BM yesterday. Agrees to surgery today stating he just wants the problem removed.    Objective: BP 123/75 (BP Location: Left Arm)   Pulse 62   Temp 98.5 F (36.9 C)   Resp 18   Ht 5\' 11"  (1.803 m)   Wt 59.9 kg (132 lb 0.9 oz)   SpO2 100%   BMI 18.42 kg/m  Gen: alert, NAD, cooperative Pulm: normal effort Abd: thin, +BS, palpation LLQ elicits mild RLQ pain, TTP RLQ without rebound, guarding, or peritonitis.   A&P Acute appendicitis - VSS, no leukocytosis, fecalith present on CT - NPO - IV abx - OR today for lap appy   FEN: NPO, IVF ID: Rocephin/Flagyl VTE: SCD's, resume lovenox post-op  Hosie SpangleElizabeth Stanley Helmuth, [facility] Pager: [phone] Consults: [phone] Mon-Fri 7:00 am-4:30 pm Sat-Sun 7:00 am-11:30 am

## 2017-11-09 NOTE — ED Notes (Signed)
Gave report to Jeanette CapriceSophia, Charity fundraiserN for room [id number].

## 2017-11-09 NOTE — Anesthesia Postprocedure Evaluation (Signed)
Anesthesia Post Note  Patient: Brandon Richards  Procedure(s) Performed: APPENDECTOMY LAPAROSCOPIC (N/A Abdomen)     Patient location during evaluation: PACU Anesthesia Type: General Level of consciousness: sedated Pain management: pain level controlled Vital Signs Assessment: post-procedure vital signs reviewed and stable Respiratory status: spontaneous breathing and respiratory function stable Cardiovascular status: stable Postop Assessment: no apparent nausea or vomiting Anesthetic complications: no    Last Vitals:  Vitals:   11/09/17 1615 11/09/17 1630  BP: 128/84 125/84  Pulse: 84 92  Resp: 18 19  Temp:  37.3 C  SpO2: 100% 99%    Last Pain:  Vitals:   11/09/17 1630  TempSrc:   PainSc: Asleep                 Garfield Coiner DANIEL

## 2017-11-09 NOTE — Transfer of Care (Signed)
Immediate Anesthesia Transfer of Care Note  Patient: Brandon Richards  Procedure(s) Performed: APPENDECTOMY LAPAROSCOPIC (N/A Abdomen)  Patient Location: PACU  Anesthesia Type:General  Level of Consciousness: awake, alert , oriented and patient cooperative  Airway & Oxygen Therapy: Patient Spontanous Breathing and Patient connected to face mask oxygen  Post-op Assessment: Report given to RN and Post -op Vital signs reviewed and stable  Post vital signs: Reviewed and stable  Last Vitals:  Vitals Value Taken Time  BP 129/91 11/09/2017  3:41 PM  Temp    Pulse 100 11/09/2017  3:41 PM  Resp 16 11/09/2017  3:41 PM  SpO2 100 % 11/09/2017  3:41 PM  Vitals shown include unvalidated device data.  Last Pain:  Vitals:   11/09/17 0900  TempSrc:   PainSc: 0-No pain      Patients Stated Pain Goal: 3 (11/09/17 0517)  Complications: No apparent anesthesia complications

## 2017-11-09 NOTE — Anesthesia Procedure Notes (Signed)
Procedure Name: Intubation Date/Time: 11/09/2017 2:52 PM Performed by: Elyn PeersAllen, Joban Colledge J, CRNA Pre-anesthesia Checklist: Patient identified, Emergency Drugs available, Suction available, Patient being monitored and Timeout performed Patient Re-evaluated:Patient Re-evaluated prior to induction Oxygen Delivery Method: Circle system utilized Preoxygenation: Pre-oxygenation with 100% oxygen Induction Type: IV induction, Rapid sequence and Cricoid Pressure applied Laryngoscope Size: Miller and 2 Grade View: Grade I Tube type: Oral Tube size: 7.5 mm Number of attempts: 1 Airway Equipment and Method: Stylet Placement Confirmation: ETT inserted through vocal cords under direct vision,  positive ETCO2 and breath sounds checked- equal and bilateral Secured at: 22 cm Tube secured with: Tape Dental Injury: Teeth and Oropharynx as per pre-operative assessment

## 2017-11-09 NOTE — Progress Notes (Signed)
Patient is transferred to OR.
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
## 2017-11-09 NOTE — Op Note (Signed)
Appendectomy, Lap, Procedure Note  Indications: The patient presented with a history of right-sided abdominal pain. A CT revealed findings consistent with acute appendicitis.  Pre-operative Diagnosis: acute appendicitis  Post-operative Diagnosis: Same  Surgeon: Abigail MiyamotoBLACKMAN,Jaima Janney A   Assistants: 0  Anesthesia: General endotracheal anesthesia  ASA Class: 2  Procedure Details  The patient was seen again in the Holding Room. The risks, benefits, complications, treatment options, and expected outcomes were discussed with the patient and/or family. The possibilities of reaction to medication, perforation of viscus, bleeding, recurrent infection, finding a normal appendix, the need for additional procedures, failure to diagnose a condition, and creating a complication requiring transfusion or operation were discussed. There was concurrence with the proposed plan and informed consent was obtained. The site of surgery was properly noted. The patient was taken to Operating Room, identified as Brandon Richards and the procedure verified as Appendectomy. A Time Out was held and the above information confirmed.  The patient was placed in the supine position and general anesthesia was induced, along with placement of orogastric tube, Venodyne boots, and a Foley catheter. The abdomen was prepped and draped in a sterile fashion. A one centimeter infraumbilical incision was made.  The umbilical  midline fascia was incised with a #15 blade.  A Kelly clamp was used to confirm entrance into the peritoneal cavity.  A pursestring suture was passed around the incision with a 0 Vicryl.  The Hasson was introduced into the abdomen and the tails of the suture were used to hold the Hasson in place.   The pneumoperitoneum was then established to steady pressure of 15 mmHg.  Additional 5 mm cannulas then placed in the left lower quadrant of the abdomen and the right upper quadrant under direct visualization. A careful  evaluation of the entire abdomen was carried out. The patient was placed in Trendelenburg and left lateral decubitus position. The small intestines were retracted in the cephalad and left lateral direction away from the pelvis and right lower quadrant. The patient was found to have an enlarged and inflamed appendix that was extending into the pelvis. There was no evidence of perforation.  The appendix was carefully dissected. The appendix was was skeletonized with the harmonic scalpel.   The appendix was divided at its base using an endo-GIA stapler. Minimal appendiceal stump was left in place. There was no evidence of bleeding, leakage, or complication after division of the appendix. Irrigation was also performed and irrigate suctioned from the abdomen as well.  The umbilical port site was closed with the purse string suture. There was no residual palpable fascial defect.  The trocar site skin wounds were closed with 4-0 Monocryl.  Instrument, sponge, and needle counts were correct at the conclusion of the case.   Findings: The appendix was found to be inflamed. There were signs of necrosis.  There was not perforation. There was not abscess formation.  Estimated Blood Loss:  Minimal         Drains:none         Complications:  None; patient tolerated the procedure well.         Disposition: PACU - hemodynamically stable.         Condition: stable

## 2017-11-09 NOTE — Anesthesia Preprocedure Evaluation (Signed)
Anesthesia Evaluation  Patient identified by MRN, date of birth, ID band Patient awake    Reviewed: Allergy & Precautions, NPO status , Patient's Chart, lab work & pertinent test results  History of Anesthesia Complications Negative for: history of anesthetic complications  Airway Mallampati: II  TM Distance: >3 FB Neck ROM: Full    Dental  (+) Chipped, Dental Advisory Given, Poor Dentition   Pulmonary Current Smoker,    Pulmonary exam normal        Cardiovascular negative cardio ROS Normal cardiovascular exam     Neuro/Psych negative neurological ROS  negative psych ROS   GI/Hepatic negative GI ROS, Neg liver ROS,   Endo/Other  negative endocrine ROS  Renal/GU negative Renal ROS  negative genitourinary   Musculoskeletal negative musculoskeletal ROS (+)   Abdominal   Peds negative pediatric ROS (+)  Hematology negative hematology ROS (+)   Anesthesia Other Findings   Reproductive/Obstetrics negative OB ROS                             Anesthesia Physical Anesthesia Plan  ASA: II and emergent  Anesthesia Plan: General   Post-op Pain Management:    Induction: Intravenous, Rapid sequence and Cricoid pressure planned  PONV Risk Score and Plan: 2 and Ondansetron and Dexamethasone  Airway Management Planned: Oral ETT  Additional Equipment:   Intra-op Plan:   Post-operative Plan: Extubation in OR  Informed Consent: I have reviewed the patients History and Physical, chart, labs and discussed the procedure including the risks, benefits and alternatives for the proposed anesthesia with the patient or authorized representative who has indicated his/her understanding and acceptance.   Dental advisory given  Plan Discussed with: CRNA and Anesthesiologist  Anesthesia Plan Comments:         Anesthesia Quick Evaluation

## 2017-11-10 ENCOUNTER — Encounter (HOSPITAL_COMMUNITY): Payer: Self-pay | Admitting: Surgery

## 2017-11-10 MED ORDER — OXYCODONE HCL 5 MG PO TABS: 5.0000 mg | ORAL_TABLET | Freq: Four times a day (QID) | ORAL | 0 refills | Status: DC | PRN

## 2017-11-10 MED ORDER — OXYCODONE HCL 5 MG PO TABS: 5.0000 mg | ORAL_TABLET | Freq: Four times a day (QID) | ORAL | 0 refills | Status: AC | PRN

## 2017-11-10 MED ORDER — ACETAMINOPHEN 325 MG PO TABS: 650.0000 mg | ORAL_TABLET | Freq: Four times a day (QID) | ORAL | Status: AC | PRN

## 2017-11-10 MED ORDER — AMOXICILLIN-POT CLAVULANATE 875-125 MG PO TABS: 1.0000 | ORAL_TABLET | Freq: Two times a day (BID) | ORAL | 0 refills | Status: AC

## 2017-11-10 NOTE — Discharge Summary (Addendum)
[facility] Discharge Summary   Patient ID: Brandon Richards MRN: [id number] DOB/AGE: March 24, 1991 27 y.o.  Admit date: 11/08/2017 Discharge date: 11/10/2017  Discharge Diagnosis Patient Active Problem List   Diagnosis Date Noted  . History of acute alcohol intoxication 11/08/2017  . Acute appendicitis 11/08/2017  . Alcohol consumption binge drinking 11/08/2017  . Tobacco abuse 11/08/2017   Imaging: Ct Abdomen Pelvis W Contrast  Result Date: 11/08/2017 CLINICAL DATA:  Generalized abdominal pain and vomiting. EXAM: CT ABDOMEN AND PELVIS WITH CONTRAST TECHNIQUE: Multidetector CT imaging of the abdomen and pelvis was performed using the standard protocol following bolus administration of intravenous contrast. CONTRAST:  <See Chart> ISOVUE-300 IOPAMIDOL (ISOVUE-300) INJECTION 61% COMPARISON:  None. FINDINGS: Lower chest: No acute abnormality. Hepatobiliary: No focal liver abnormality is seen. No gallstones, gallbladder wall thickening, or biliary dilatation. Pancreas: Unremarkable. No pancreatic ductal dilatation or surrounding inflammatory changes. Spleen: Normal in size without focal abnormality. Adrenals/Urinary Tract: Adrenal glands are unremarkable. Kidneys are normal, without renal calculi, focal lesion, or hydronephrosis. Bladder is unremarkable. Stomach/Bowel: Stomach is within normal limits. There is a tubular structure within the right lower quadrant that measures up to 11 mm in diameter, containing two small oval calcifications. This likely reflects the appendix containing appendicoliths. There is no mucosal hyperenhancement or surrounding inflammatory changes. No evidence of bowel wall thickening, distention, or inflammatory changes. Vascular/Lymphatic: No significant vascular findings are present. No enlarged abdominal or pelvic lymph nodes. Reproductive: Prostate is unremarkable. Other: No abdominal wall hernia or abnormality. No abdominopelvic ascites. No pneumoperitoneum.  Musculoskeletal: No acute or significant osseous findings. IMPRESSION: 1. 11 mm tubular structure within the right lower quadrant containing two small oval calcifications likely represents a dilated appendix containing appendicoliths. No mucosal hyperenhancement or surrounding inflammatory changes. Correlate clinically for acute appendicitis. Electronically Signed   By: Obie Dredge M.D.   On: 11/08/2017 21:22    Procedures Dr. Abigail Miyamoto (11/09/17) - Laparoscopic Appendectomy  Hospital Course:  27 y/o male with a history of tobacco and binge alcohol drinking who presented to [facility] with abdominal pain.  Workup showed above CT scan consistent with acute appendicitis.  Patient was admitted and underwent procedure listed above. Intraoperatively found to have some necrosis of the appendix without perforation or abscess.  Tolerated procedure well and was transferred to the floor.  Diet was advanced as tolerated.  On POD#1, the patient was voiding well, tolerating diet, ambulating well, pain well controlled, vital signs stable, incisions c/d/i and felt stable for discharge home on oral antibiotics.  Patient will follow up in our office in 2 weeks and knows to call with questions or concerns. Patient instructed on activity restrictions, return to work, and not to take alcohol with pain medication.  Physical Exam: General:  Alert, NAD, pleasant, comfortable Abd:  Soft, appropriately tender, non-distended, incisions clean and dry, +BS  Allergies as of 11/10/2017   No Known Allergies     Medication List    STOP taking these medications   methocarbamol 500 MG tablet Commonly known as:  ROBAXIN   naproxen 500 MG tablet Commonly known as:  NAPROSYN     TAKE these medications   acetaminophen 325 MG tablet Commonly known as:  TYLENOL Take 2 tablets (650 mg total) by mouth every 6 (six) hours as needed for mild pain (or temp > 100). What changed:    medication strength  how much to  take  when to take this  reasons to take this   amoxicillin-clavulanate 875-125 MG tablet Commonly  known as:  AUGMENTIN Take 1 tablet by mouth 2 (two) times daily for 7 days.   oxyCODONE 5 MG immediate release tablet Commonly known as:  Oxy IR/ROXICODONE Take 1 tablet (5 mg total) by mouth every 6 (six) hours as needed for moderate pain, severe pain or breakthrough pain. DO NOT TAKE WITH ALCOHOL.       Follow-up Information    [facility].   Specialty:  General Surgery Why:  call as soon as possible to confirm post-operative appointment date/time. Contact information: [address] [place] [id number] [phone]          Signed: Hosie SpangleElizabeth Norman Bier, [facility] 11/10/2017, 9:49 AM Pager: [phone] Consults: [phone] Mon-Fri 7:00 am-4:30 pm Sat-Sun 7:00 am-11:30 am

## 2017-11-10 NOTE — Discharge Instructions (Signed)

## 2017-11-10 NOTE — Progress Notes (Signed)
Patient discharged to home with family. Given all belongings, instructions, prescriptions, note for work. Verbalized understanding of all instructions. Escorted to pov.

## 2018-04-07 ENCOUNTER — Emergency Department (HOSPITAL_COMMUNITY)
Admission: EM | Admit: 2018-04-07 | Discharge: 2018-04-07 | Disposition: A | Discharge status: Home / Self Care | Payer: PRIVATE HEALTH INSURANCE | Attending: Emergency Medicine | Admitting: Emergency Medicine

## 2018-04-07 ENCOUNTER — Other Ambulatory Visit: Payer: Self-pay

## 2018-04-07 DIAGNOSIS — Y929 Unspecified place or not applicable: Secondary | ICD-10-CM | POA: Insufficient documentation

## 2018-04-07 DIAGNOSIS — Y939 Activity, unspecified: Secondary | ICD-10-CM | POA: Insufficient documentation

## 2018-04-07 DIAGNOSIS — Y33XXXA Other specified events, undetermined intent, initial encounter: Secondary | ICD-10-CM | POA: Insufficient documentation

## 2018-04-07 DIAGNOSIS — F17210 Nicotine dependence, cigarettes, uncomplicated: Secondary | ICD-10-CM | POA: Insufficient documentation

## 2018-04-07 DIAGNOSIS — Y998 Other external cause status: Secondary | ICD-10-CM | POA: Insufficient documentation

## 2018-04-07 DIAGNOSIS — S61511A Laceration without foreign body of right wrist, initial encounter: Secondary | ICD-10-CM | POA: Insufficient documentation

## 2018-04-07 NOTE — ED Notes (Signed)
Pt's laceration has been sutured by EDP.  Wound has been washed and dressed.  [employer] at bedside.
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
## 2018-04-07 NOTE — Discharge Instructions (Signed)
You were seen in the ED today with wrist laceration. The wound was repaired and stitches will need to be removed in 7 days.

## 2019-10-16 IMAGING — CT CT ABD-PELV W/ CM
2 of 4 series · 15 of 46 positions shown, 17 images · IV contrast (ISOVUE)
Comparison: None.

CLINICAL DATA: Generalized abdominal pain and vomiting.

EXAM:
CT ABDOMEN AND PELVIS WITH CONTRAST
TECHNIQUE: Multidetector CT imaging of the abdomen and pelvis was performed
using the standard protocol following bolus administration of
intravenous contrast.
CONTRAST:  <See Chart> AXWAPC-FZZ IOPAMIDOL (AXWAPC-FZZ) INJECTION
61%

[Series 2: axial st · axial · 0.74mm/px · z∈[-586,-201]mm · 12 of 87 slices shown, 14 images]
[im 5/87  soft-tissue]
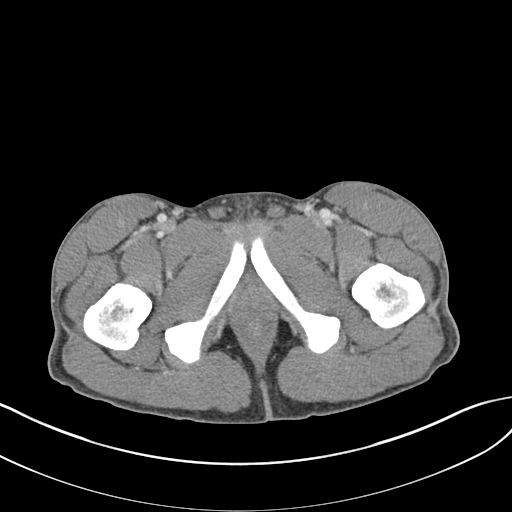
[im 5/87  bone]
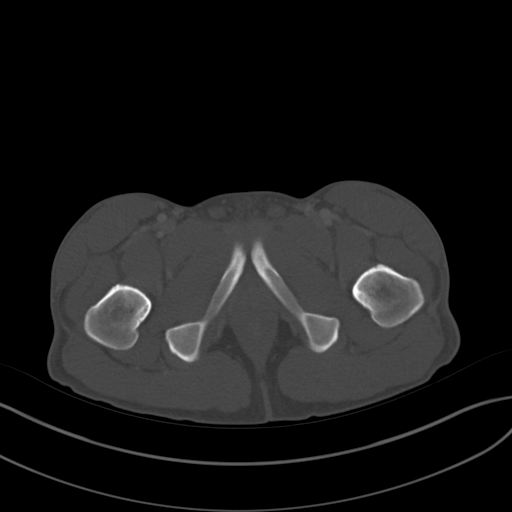
[im 14/87  soft-tissue]
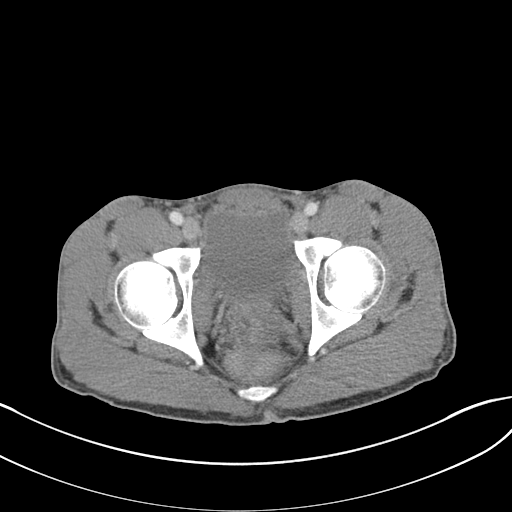
[im 19/87  soft-tissue]
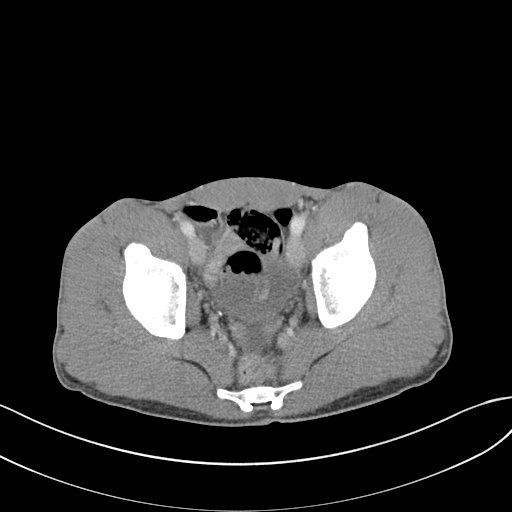
[im 28/87  soft-tissue]
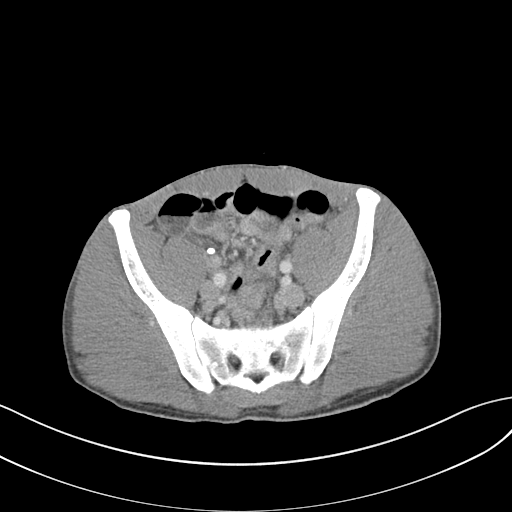
[im 32/87  soft-tissue]
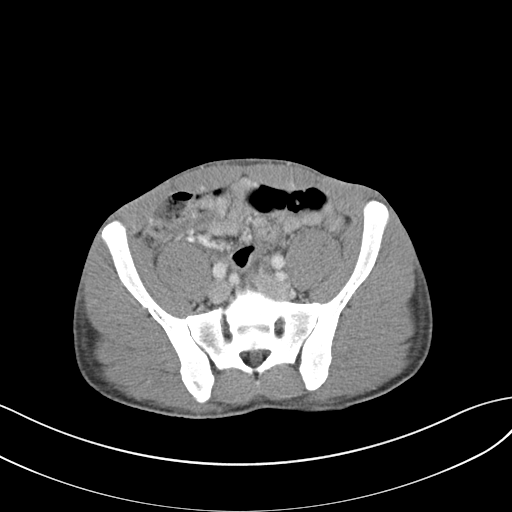
[im 41/87  soft-tissue]
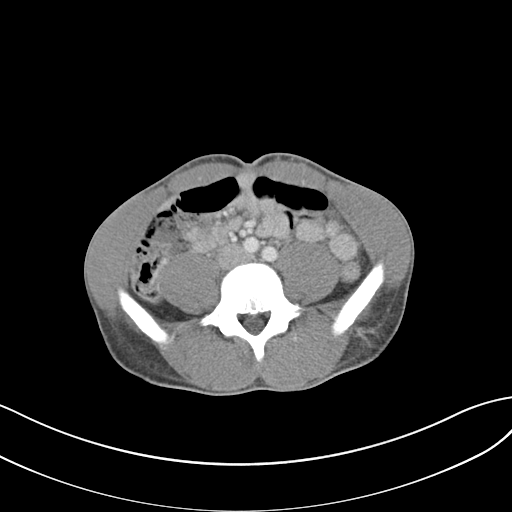
[im 46/87  soft-tissue]
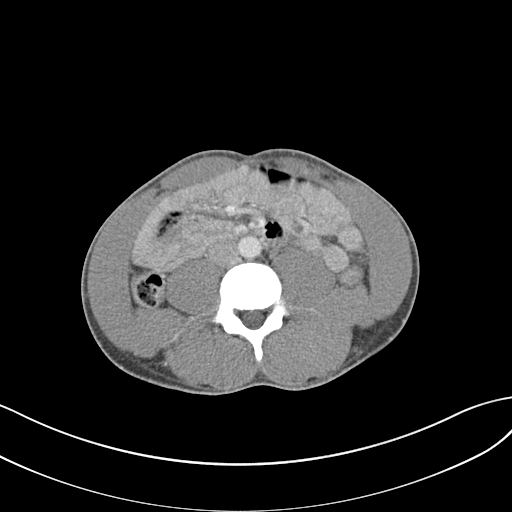
[im 55/87  soft-tissue]
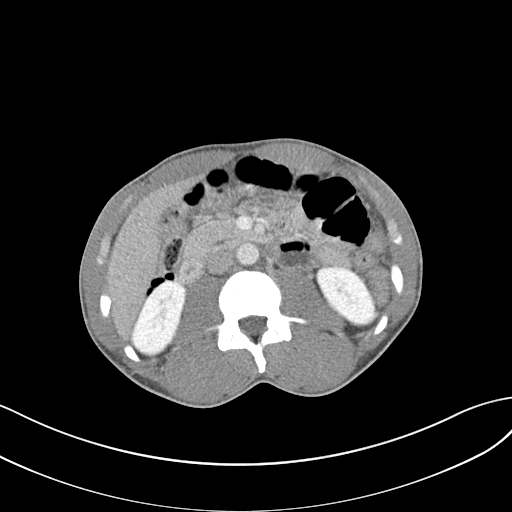
[im 59/87  soft-tissue]
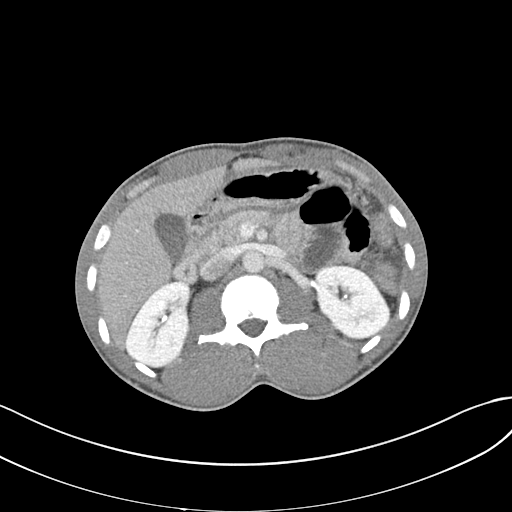
[im 59/87  bone]
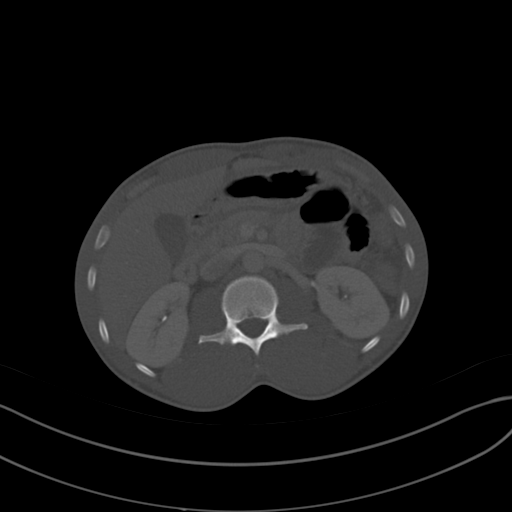
[im 68/87  soft-tissue]
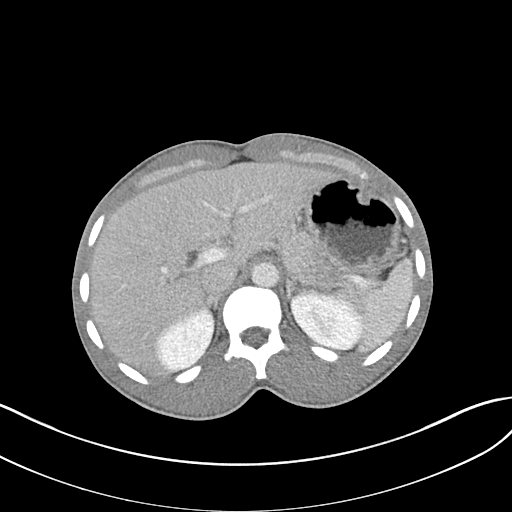
[im 73/87  soft-tissue]
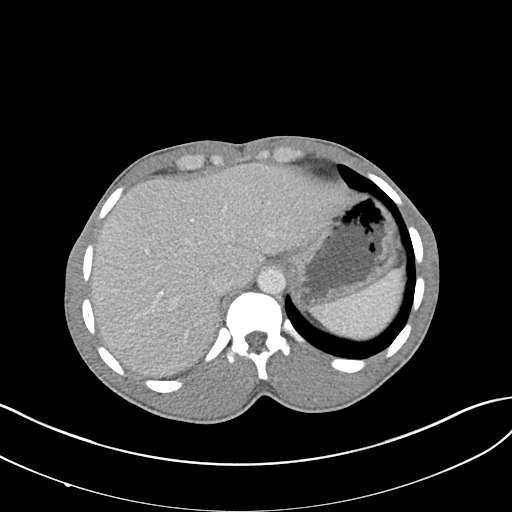
[im 82/87  soft-tissue]
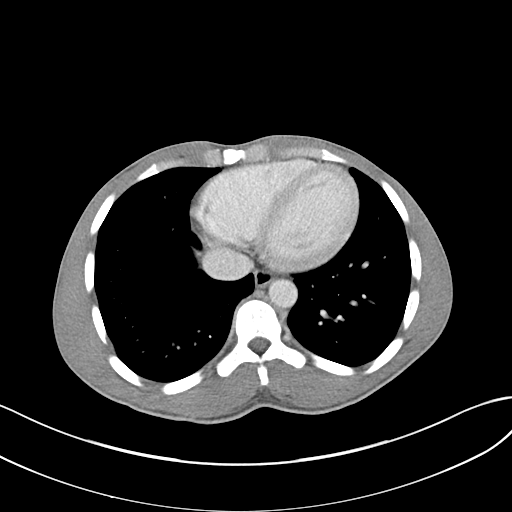

[Series 5: coronal st · coronal · 0.59mm/px · 3 of 75 slices shown]
[im 25/75  soft-tissue]
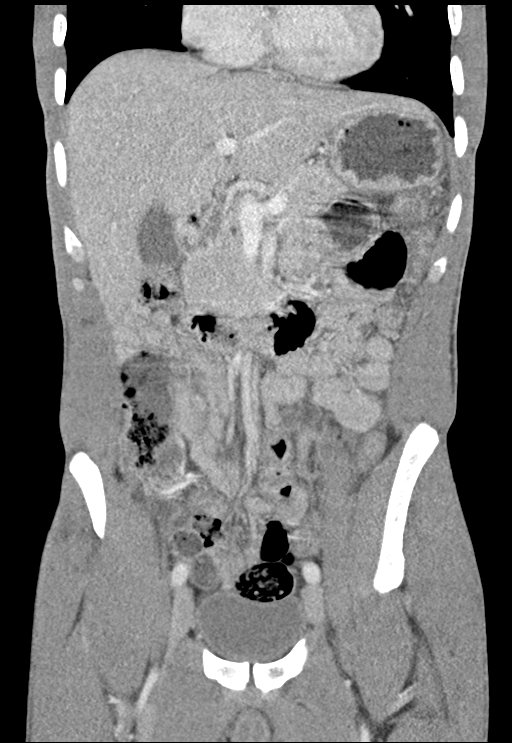
[im 33/75  soft-tissue]
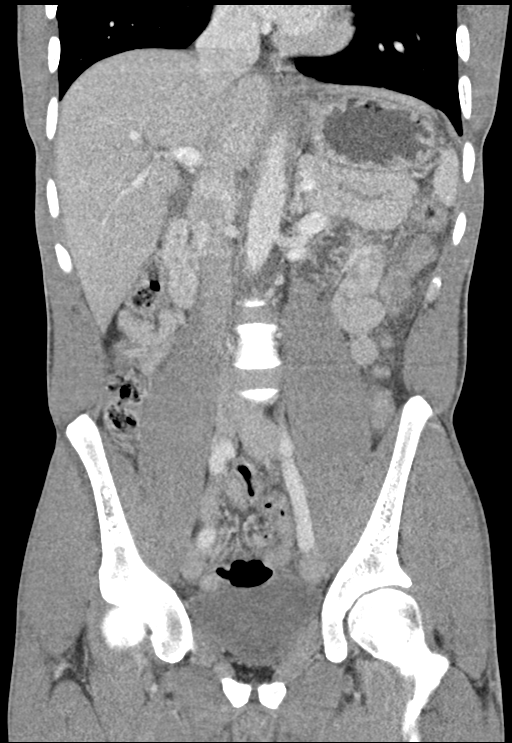
[im 42/75  soft-tissue]
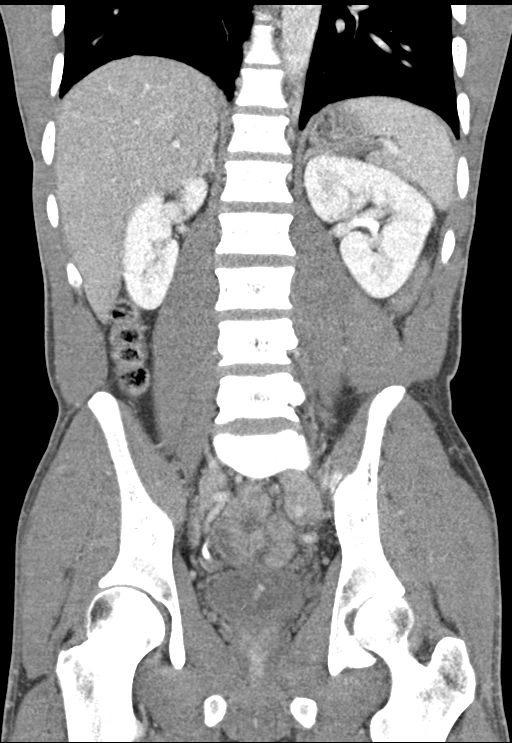

[15 of 46 positions shown; findings below may reference images not displayed]

FINDINGS: Lower chest: No acute abnormality.

Hepatobiliary: No focal liver abnormality is seen. No gallstones,
gallbladder wall thickening, or biliary dilatation.

Pancreas: Unremarkable. No pancreatic ductal dilatation or
surrounding inflammatory changes.

Spleen: Normal in size without focal abnormality.

Adrenals/Urinary Tract: Adrenal glands are unremarkable. Kidneys are
normal, without renal calculi, focal lesion, or hydronephrosis.
Bladder is unremarkable.

Stomach/Bowel: Stomach is within normal limits. There is a tubular
structure within the right lower quadrant that measures up to 11 mm
in diameter, containing two small oval calcifications. This likely
reflects the appendix containing appendicoliths. There is no mucosal
hyperenhancement or surrounding inflammatory changes. No evidence of
bowel wall thickening, distention, or inflammatory changes.

Vascular/Lymphatic: No significant vascular findings are present. No
enlarged abdominal or pelvic lymph nodes.

Reproductive: Prostate is unremarkable.

Other: No abdominal wall hernia or abnormality. No abdominopelvic
ascites. No pneumoperitoneum.

Musculoskeletal: No acute or significant osseous findings.
IMPRESSION: 1. 11 mm tubular structure within the right lower quadrant
containing two small oval calcifications likely represents a dilated
appendix containing appendicoliths. No mucosal hyperenhancement or
surrounding inflammatory changes. Correlate clinically for acute
appendicitis.

## 2020-03-18 ENCOUNTER — Other Ambulatory Visit: Payer: Self-pay

## 2020-03-18 DIAGNOSIS — Z20822 Contact with and (suspected) exposure to covid-19: Secondary | ICD-10-CM | POA: Insufficient documentation

## 2020-03-18 DIAGNOSIS — R0981 Nasal congestion: Secondary | ICD-10-CM | POA: Insufficient documentation

## 2020-03-18 DIAGNOSIS — R439 Unspecified disturbances of smell and taste: Secondary | ICD-10-CM | POA: Diagnosis not present

## 2020-03-18 DIAGNOSIS — J069 Acute upper respiratory infection, unspecified: Secondary | ICD-10-CM | POA: Insufficient documentation

## 2020-03-18 DIAGNOSIS — F17210 Nicotine dependence, cigarettes, uncomplicated: Secondary | ICD-10-CM | POA: Diagnosis not present

## 2020-03-18 DIAGNOSIS — R05 Cough: Secondary | ICD-10-CM | POA: Insufficient documentation

## 2020-03-18 DIAGNOSIS — R5383 Other fatigue: Secondary | ICD-10-CM | POA: Insufficient documentation

## 2020-03-19 ENCOUNTER — Encounter (HOSPITAL_COMMUNITY): Payer: Self-pay

## 2020-03-19 ENCOUNTER — Emergency Department (HOSPITAL_COMMUNITY)
Admission: EM | Admit: 2020-03-19 | Discharge: 2020-03-19 | Disposition: A | Discharge status: Home / Self Care | Payer: BC Managed Care – PPO | Attending: Emergency Medicine | Admitting: Emergency Medicine

## 2020-03-19 DIAGNOSIS — J069 Acute upper respiratory infection, unspecified: Secondary | ICD-10-CM

## 2020-03-19 LAB — SARS CORONAVIRUS 2 BY RT PCR (HOSPITAL ORDER, PERFORMED IN ~~LOC~~ HOSPITAL LAB): SARS Coronavirus 2: NEGATIVE

## 2020-03-19 NOTE — ED Triage Notes (Signed)
Patient reports loss of taste and smell for past 3-4 days. Patient also has dry cough and fatigue
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
## 2020-03-19 NOTE — Discharge Instructions (Signed)
Thank you for allowing me to care for you today in the Emergency Department.   Your COVID-19 test was negative today.  You can use over-the-counter medications, such as saline nasal spray and cough and cold medicine for nasal congestion.  Make sure you are getting plenty of rest and hydrating well.  Since you have had several Covid exposures at work, if you develop chest pain, shortness of breath, fever, chills, or other new, concerning symptoms, you should be retested for COVID-19.  Patient also return to the emergency department if you develop significant difficulty breathing, if you pass out, or if you develop other new, concerning symptoms.

## 2022-05-04 ENCOUNTER — Telehealth (HOSPITAL_COMMUNITY): Payer: Self-pay | Admitting: Professional

## 2024-07-13 ENCOUNTER — Other Ambulatory Visit: Payer: Self-pay

## 2024-07-13 ENCOUNTER — Emergency Department (HOSPITAL_COMMUNITY)
Admission: EM | Admit: 2024-07-13 | Discharge: 2024-07-13 | Discharge status: Against Medical Advice | Attending: Emergency Medicine | Admitting: Emergency Medicine
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
## 2024-07-13 NOTE — ED Notes (Signed)
 Pt seen laying on floor by [employer] staff. Staff attempted to get pt o get off of floor onto chair multiple times, pt uncooperative and states I just want to sleep. Security asked pt if he was going to be seen and he repeated multiple times I just want to sleep. Pt still not cooperative and verbally aggressive with staff. Pt escorted out by [employer].
